# Patient Record
Sex: Male | Born: 1960 | Hispanic: No | Marital: Single | Smoking: Current every day smoker
Health system: Southern US, Community
[De-identification: ages and names within clinical notes are randomized; demographics above are authoritative.]

## PROBLEM LIST (undated history)

## (undated) DIAGNOSIS — F4310 Post-traumatic stress disorder, unspecified: Secondary | ICD-10-CM

## (undated) DIAGNOSIS — F329 Major depressive disorder, single episode, unspecified: Secondary | ICD-10-CM

## (undated) DIAGNOSIS — F32A Depression, unspecified: Secondary | ICD-10-CM

## (undated) HISTORY — DX: Depression, unspecified: F32.A

## (undated) HISTORY — DX: Major depressive disorder, single episode, unspecified: F32.9

## (undated) HISTORY — DX: Post-traumatic stress disorder, unspecified: F43.10

---

## 2003-08-16 ENCOUNTER — Encounter: Payer: Self-pay | Admitting: Emergency Medicine

## 2003-08-16 ENCOUNTER — Emergency Department (HOSPITAL_COMMUNITY): Admission: AD | Admit: 2003-08-16 | Discharge: 2003-08-16 | Payer: Self-pay | Admitting: Emergency Medicine

## 2006-07-30 ENCOUNTER — Emergency Department (HOSPITAL_COMMUNITY): Admission: EM | Admit: 2006-07-30 | Discharge: 2006-07-30 | Payer: Self-pay | Admitting: Emergency Medicine

## 2007-07-06 ENCOUNTER — Emergency Department (HOSPITAL_COMMUNITY): Admission: EM | Admit: 2007-07-06 | Discharge: 2007-07-06 | Payer: Self-pay | Admitting: *Deleted

## 2017-07-16 ENCOUNTER — Ambulatory Visit (INDEPENDENT_AMBULATORY_CARE_PROVIDER_SITE_OTHER): Payer: 59 | Admitting: Emergency Medicine

## 2017-07-16 ENCOUNTER — Encounter: Payer: Self-pay | Admitting: Emergency Medicine

## 2017-07-16 ENCOUNTER — Other Ambulatory Visit: Payer: Self-pay | Admitting: Emergency Medicine

## 2017-07-16 VITALS — BP 129/78 | HR 49 | Temp 97.9°F | Resp 16 | Ht 73.0 in | Wt 155.2 lb

## 2017-07-16 DIAGNOSIS — Z23 Encounter for immunization: Secondary | ICD-10-CM

## 2017-07-16 DIAGNOSIS — Z0001 Encounter for general adult medical examination with abnormal findings: Secondary | ICD-10-CM

## 2017-07-16 DIAGNOSIS — N342 Other urethritis: Secondary | ICD-10-CM | POA: Diagnosis not present

## 2017-07-16 DIAGNOSIS — Z0000 Encounter for general adult medical examination without abnormal findings: Secondary | ICD-10-CM | POA: Diagnosis not present

## 2017-07-16 MED ORDER — AZITHROMYCIN 1 G PO PACK: 1.0000 g | PACK | Freq: Once | ORAL | 0 refills | Status: AC

## 2017-07-16 MED ORDER — CEFTRIAXONE SODIUM 250 MG IJ SOLR
250.0000 mg | Freq: Once | INTRAMUSCULAR | Status: AC
  Administered 2017-07-16: 250 mg via INTRAMUSCULAR

## 2017-07-16 NOTE — Addendum Note (Signed)
Addended by: Beau Fanny on: 07/16/2017 01:45 PM   Modules accepted: Orders

## 2017-07-16 NOTE — Patient Instructions (Addendum)
IF you received an x-ray today, you will receive an invoice from [facility]. Please contact [facility] at [phone] with questions or concerns regarding your invoice.   IF you received labwork today, you will receive an invoice from [facility]. Please contact [facility] at [phone] with questions or concerns regarding your invoice.   Our billing staff will not be able to assist you with questions regarding bills from these companies.  You will be contacted with the lab results as soon as they are available. The fastest way to get your results is to activate your My Chart account. Instructions are located on the last page of this paperwork. If you have not heard from [place] regarding the results in 2 weeks, please contact this office.        Health Maintenance, Male A healthy lifestyle and preventive care is important for your health and wellness. Ask your health care provider about what schedule of regular examinations is right for you. What should I know about weight and diet? Eat a Healthy Diet  Eat plenty of vegetables, fruits, whole grains, low-fat dairy products, and lean protein.  Do not eat a lot of foods high in solid fats, added sugars, or salt.  Maintain a Healthy Weight Regular exercise can help you achieve or maintain a healthy weight. You should:  Do at least 150 minutes of exercise each week. The exercise should increase your heart rate and make you sweat (moderate-intensity exercise).  Do strength-training exercises at least twice a week.  Watch Your Levels of Cholesterol and Blood Lipids  Have your blood tested for lipids and cholesterol every 5 years starting at 56 years of age. If you are at high risk for heart disease, you should start having your blood tested when you are 56 years old. You may need to have your cholesterol levels checked more often if: ? Your lipid or cholesterol levels are high. ? You are older than 56 years of  age. ? You are at high risk for heart disease.  What should I know about cancer screening? Many types of cancers can be detected early and may often be prevented. Lung Cancer  You should be screened every year for lung cancer if: ? You are a current smoker who has smoked for at least 30 years. ? You are a former smoker who has quit within the past 15 years.  Talk to your health care provider about your screening options, when you should start screening, and how often you should be screened.  Colorectal Cancer  Routine colorectal cancer screening usually begins at 56 years of age and should be repeated every 5-10 years until you are 56 years old. You may need to be screened more often if early forms of precancerous polyps or small growths are found. Your health care provider may recommend screening at an earlier age if you have risk factors for colon cancer.  Your health care provider may recommend using home test kits to check for hidden blood in the stool.  A small camera at the end of a tube can be used to examine your colon (sigmoidoscopy or colonoscopy). This checks for the earliest forms of colorectal cancer.  Prostate and Testicular Cancer  Depending on your age and overall health, your health care provider may do certain tests to screen for prostate and testicular cancer.  Talk to your health care provider about any symptoms or concerns you have about testicular or prostate cancer.  Skin Cancer  Check  your skin from head to toe regularly.  Tell your health care provider about any new moles or changes in moles, especially if: ? There is a change in a mole's size, shape, or color. ? You have a mole that is larger than a pencil eraser.  Always use sunscreen. Apply sunscreen liberally and repeat throughout the day.  Protect yourself by wearing long sleeves, pants, a wide-brimmed hat, and sunglasses when outside.  What should I know about heart disease, diabetes, and high  blood pressure?  If you are 18-39 years of age, have your blood pressure checked every 3-5 years. If you are 40 years of age or older, have your blood pressure checked every year. You should have your blood pressure measured twice-once when you are at a hospital or clinic, and once when you are not at a hospital or clinic. Record the average of the two measurements. To check your blood pressure when you are not at a hospital or clinic, you can use: ? An automated blood pressure machine at a pharmacy. ? A home blood pressure monitor.  Talk to your health care provider about your target blood pressure.  If you are between 45-79 years old, ask your health care provider if you should take aspirin to prevent heart disease.  Have regular diabetes screenings by checking your fasting blood sugar level. ? If you are at a normal weight and have a low risk for diabetes, have this test once every three years after the age of 45. ? If you are overweight and have a high risk for diabetes, consider being tested at a younger age or more often.  A one-time screening for abdominal aortic aneurysm (AAA) by ultrasound is recommended for men aged 65-75 years who are current or former smokers. What should I know about preventing infection? Hepatitis B If you have a higher risk for hepatitis B, you should be screened for this virus. Talk with your health care provider to find out if you are at risk for hepatitis B infection. Hepatitis C Blood testing is recommended for:  Everyone born from 1945 through 1965.  Anyone with known risk factors for hepatitis C.  Sexually Transmitted Diseases (STDs)  You should be screened each year for STDs including gonorrhea and chlamydia if: ? You are sexually active and are younger than 56 years of age. ? You are older than 56 years of age and your health care provider tells you that you are at risk for this type of infection. ? Your sexual activity has changed since you were  last screened and you are at an increased risk for chlamydia or gonorrhea. Ask your health care provider if you are at risk.  Talk with your health care provider about whether you are at high risk of being infected with HIV. Your health care provider may recommend a prescription medicine to help prevent HIV infection.  What else can I do?  Schedule regular health, dental, and eye exams.  Stay current with your vaccines (immunizations).  Do not use any tobacco products, such as cigarettes, chewing tobacco, and e-cigarettes. If you need help quitting, ask your health care provider.  Limit alcohol intake to no more than 2 drinks per day. One drink equals 12 ounces of beer, 5 ounces of wine, or 1 ounces of hard liquor.  Do not use street drugs.  Do not share needles.  Ask your health care provider for help if you need support or information about quitting drugs.  Tell your   health care provider if you often feel depressed.  Tell your health care provider if you have ever been abused or do not feel safe at home. This information is not intended to replace advice given to you by your health care provider. Make sure you discuss any questions you have with your health care provider. Document Released: 05/07/2008 Document Revised: 07/08/2016 Document Reviewed: 08/13/2015 Elsevier Interactive Patient Education  2018 [employer].  American Heart Association (AHA) Exercise Recommendation  Being physically active is important to prevent heart disease and stroke, the nation's No. 1and No. 5killers. To improve overall cardiovascular health, we suggest at least 150 minutes per week of moderate exercise or 75 minutes per week of vigorous exercise (or a combination of moderate and vigorous activity). Thirty minutes a day, five times a week is an easy goal to remember. You will also experience benefits even if you divide your time into two or three segments of 10 to 15 minutes per day.  For people who  would benefit from lowering their blood pressure or cholesterol, we recommend 40 minutes of aerobic exercise of moderate to vigorous intensity three to four times a week to lower the risk for heart attack and stroke.  Physical activity is anything that makes you move your body and burn calories.  This includes things like climbing stairs or playing sports. Aerobic exercises benefit your heart, and include walking, jogging, swimming or biking. Strength and stretching exercises are best for overall stamina and flexibility.  The simplest, positive change you can make to effectively improve your heart health is to start walking. It's enjoyable, free, easy, social and great exercise. A walking program is flexible and boasts high success rates because people can stick with it. It's easy for walking to become a regular and satisfying part of life.   For Overall Cardiovascular Health:  At least 30 minutes of moderate-intensity aerobic activity at least 5 days per week for a total of 150  OR   At least 25 minutes of vigorous aerobic activity at least 3 days per week for a total of 75 minutes; or a combination of moderate- and vigorous-intensity aerobic activity  AND   Moderate- to high-intensity muscle-strengthening activity at least 2 days per week for additional health benefits.  For Lowering Blood Pressure and Cholesterol  An average 40 minutes of moderate- to vigorous-intensity aerobic activity 3 or 4 times per week  What if I can't make it to the time goal? Something is always better than nothing! And everyone has to start somewhere. Even if you've been sedentary for years, today is the day you can begin to make healthy changes in your life. If you don't think you'll make it for 30 or 40 minutes, set a reachable goal for today. You can work up toward your overall goal by increasing your time as you get stronger. Don't let all-or-nothing thinking rob you of doing what you can every day.   Source:http://www.heart.org    Urethritis, Adult Urethritis is an inflammation of the tube through which urine exits your bladder (urethra). What are the causes? Urethritis is often caused by an infection in your urethra. The infection can be viral, like herpes. The infection can also be bacterial, like gonorrhea. What increases the risk? Risk factors of urethritis include:  Having sex without using a condom.  Having multiple sexual partners.  Having poor hygiene.  What are the signs or symptoms? Symptoms of urethritis are less noticeable in women than in men. These symptoms include:  Burning feeling when you urinate (dysuria).  Discharge from your urethra.  Blood in your urine (hematuria).  Urinating more than usual.  How is this diagnosed? To confirm a diagnosis of urethritis, your health care provider will do the following:  Ask about your sexual history.  Perform a physical exam.  Have you provide a sample of your urine for lab testing.  Use a cotton swab to gently collect a sample from your urethra for lab testing.  How is this treated? It is important to treat urethritis. Depending on the cause, untreated urethritis may lead to serious genital infections and possibly infertility. Urethritis caused by a bacterial infection is treated with antibiotic medicine. All sexual partners must be treated. Follow these instructions at home:  Do not have sex until the test results are known and treatment is completed, even if your symptoms go away before you finish treatment.  If you were prescribed an antibiotic, finish it all even if you start to feel better. Contact a health care provider if:  Your symptoms are not improved in 3 days.  Your symptoms are getting worse.  You develop abdominal pain or pelvic pain (in women).  You develop joint pain.  You have a fever. Get help right away if:  You have severe pain in the belly, back, or side.  You have repeated  vomiting. This information is not intended to replace advice given to you by your health care provider. Make sure you discuss any questions you have with your health care provider. Document Released: 05/05/2001 Document Revised: 04/16/2016 Document Reviewed: 07/10/2013 Elsevier Interactive Patient Education  2017 [employer].

## 2017-07-16 NOTE — Progress Notes (Signed)
Troy Pacheco 56 y.o.   Chief Complaint  Patient presents with  . Establish Care  . Annual Exam    HISTORY OF PRESENT ILLNESS: This is a 56 y.o. male here for annual exam; has burning and penile discharge.  HPI   Prior to Admission medications   Not on File    No Known Allergies  There are no active problems to display for this patient.   Past Medical History:  Diagnosis Date  . Post traumatic stress disorder (PTSD)     No past surgical history on file.  Social History   Social History  . Marital status: Single    Spouse name: N/A  . Number of children: N/A  . Years of education: N/A   Occupational History  . Not on file.   Social History Main Topics  . Smoking status: Current Every Day Smoker    Packs/day: 0.50    Years: 20.00    Types: Cigarettes  . Smokeless tobacco: Never Used  . Alcohol use Not on file  . Drug use: No  . Sexual activity: Not on file   Other Topics Concern  . Not on file   Social History Narrative  . No narrative on file    Family History  Problem Relation Age of Onset  . Aneurysm Brother        brain  . Diabetes Maternal Grandmother   . Heart disease Maternal Grandmother        heart attack     Review of Systems  Constitutional: Negative.  Negative for chills, fever and weight loss.  HENT: Negative.   Eyes: Negative.   Respiratory: Negative.  Negative for cough and shortness of breath.   Cardiovascular: Negative.  Negative for chest pain and palpitations.  Gastrointestinal: Negative.  Negative for abdominal pain, blood in stool, melena and vomiting.  Genitourinary: Positive for dysuria. Negative for hematuria.       +penile discharge.  Musculoskeletal: Positive for joint pain (occasional elbow pains, mostly after work).  Skin: Negative.  Negative for rash.  Neurological: Negative.  Negative for dizziness and headaches.  Endo/Heme/Allergies: Negative.   All other systems reviewed and are negative.   Vitals:   07/16/17 1036  BP: 129/78  Pulse: (!) 49  Resp: 16  Temp: 97.9 F (36.6 C)  SpO2: 100%    Physical Exam  Constitutional: He is oriented to person, place, and time. He appears well-developed and well-nourished.  HENT:  Head: Normocephalic and atraumatic.  Eyes: Pupils are equal, round, and reactive to light. Conjunctivae and EOM are normal.  Neck: Normal range of motion. Neck supple.  Cardiovascular: Normal rate, regular rhythm, normal heart sounds and intact distal pulses.   Pulmonary/Chest: Effort normal and breath sounds normal.  Abdominal: Soft. Bowel sounds are normal. He exhibits no distension and no mass. There is no tenderness. No hernia. Hernia confirmed negative in the right inguinal area and confirmed negative in the left inguinal area.  Genitourinary: Testes normal. Circumcised. Discharge (purulent) found.  Musculoskeletal: Normal range of motion. He exhibits no edema or deformity.  Mild tenderness to medial epicondyles of both elbows.  Lymphadenopathy: No inguinal adenopathy noted on the right or left side.  Neurological: He is alert and oriented to person, place, and time. He displays normal reflexes. No sensory deficit. He exhibits normal muscle tone.  Skin: Skin is warm and dry. Capillary refill takes less than 2 seconds. No rash noted.  Psychiatric: He has a normal mood and affect. His behavior  is normal.  Vitals reviewed.    ASSESSMENT & PLAN:  Troy Pacheco was seen today for establish care and annual exam.  Diagnoses and all orders for this visit:  Encounter for general adult medical examination with abnormal findings -     CBC with Differential -     Comprehensive metabolic panel -     Hemoglobin A1c -     Lipid panel -     Hepatitis C antibody screen -     HIV antibody -     Ambulatory referral to Gastroenterology -     Tdap vaccine greater than or equal to 7yo IM  Urethritis -     cefTRIAXone (ROCEPHIN) injection 250 mg; Inject 250 mg into the muscle  once.  Other orders -     azithromycin (ZITHROMAX) 1 g powder; Take 1 packet (1 g total) by mouth once.    Patient Instructions       IF you received an x-ray today, you will receive an invoice from [facility]. Please contact [facility] at [phone] with questions or concerns regarding your invoice.   IF you received labwork today, you will receive an invoice from [place]. Please contact [facility] at [phone] with questions or concerns regarding your invoice.   Our billing staff will not be able to assist you with questions regarding bills from these companies.  You will be contacted with the lab results as soon as they are available. The fastest way to get your results is to activate your My Chart account. Instructions are located on the last page of this paperwork. If you have not heard from [place] regarding the results in 2 weeks, please contact this office.        Health Maintenance, Male A healthy lifestyle and preventive care is important for your health and wellness. Ask your health care provider about what schedule of regular examinations is right for you. What should I know about weight and diet? Eat a Healthy Diet  Eat plenty of vegetables, fruits, whole grains, low-fat dairy products, and lean protein.  Do not eat a lot of foods high in solid fats, added sugars, or salt.  Maintain a Healthy Weight Regular exercise can help you achieve or maintain a healthy weight. You should:  Do at least 150 minutes of exercise each week. The exercise should increase your heart rate and make you sweat (moderate-intensity exercise).  Do strength-training exercises at least twice a week.  Watch Your Levels of Cholesterol and Blood Lipids  Have your blood tested for lipids and cholesterol every 5 years starting at 56 years of age. If you are at high risk for heart disease, you should start having your blood tested when you are 56 years old. You may need to  have your cholesterol levels checked more often if: ? Your lipid or cholesterol levels are high. ? You are older than 56 years of age. ? You are at high risk for heart disease.  What should I know about cancer screening? Many types of cancers can be detected early and may often be prevented. Lung Cancer  You should be screened every year for lung cancer if: ? You are a current smoker who has smoked for at least 30 years. ? You are a former smoker who has quit within the past 15 years.  Talk to your health care provider about your screening options, when you should start screening, and how often you should be screened.  Colorectal Cancer  Routine colorectal cancer  screening usually begins at 56 years of age and should be repeated every 5-10 years until you are 56 years old. You may need to be screened more often if early forms of precancerous polyps or small growths are found. Your health care provider may recommend screening at an earlier age if you have risk factors for colon cancer.  Your health care provider may recommend using home test kits to check for hidden blood in the stool.  A small camera at the end of a tube can be used to examine your colon (sigmoidoscopy or colonoscopy). This checks for the earliest forms of colorectal cancer.  Prostate and Testicular Cancer  Depending on your age and overall health, your health care provider may do certain tests to screen for prostate and testicular cancer.  Talk to your health care provider about any symptoms or concerns you have about testicular or prostate cancer.  Skin Cancer  Check your skin from head to toe regularly.  Tell your health care provider about any new moles or changes in moles, especially if: ? There is a change in a mole's size, shape, or color. ? You have a mole that is larger than a pencil eraser.  Always use sunscreen. Apply sunscreen liberally and repeat throughout the day.  Protect yourself by wearing  long sleeves, pants, a wide-brimmed hat, and sunglasses when outside.  What should I know about heart disease, diabetes, and high blood pressure?  If you are 66-34 years of age, have your blood pressure checked every 3-5 years. If you are 69 years of age or older, have your blood pressure checked every year. You should have your blood pressure measured twice-once when you are at a hospital or clinic, and once when you are not at a hospital or clinic. Record the average of the two measurements. To check your blood pressure when you are not at a hospital or clinic, you can use: ? An automated blood pressure machine at a pharmacy. ? A home blood pressure monitor.  Talk to your health care provider about your target blood pressure.  If you are between 38-64 years old, ask your health care provider if you should take aspirin to prevent heart disease.  Have regular diabetes screenings by checking your fasting blood sugar level. ? If you are at a normal weight and have a low risk for diabetes, have this test once every three years after the age of 27. ? If you are overweight and have a high risk for diabetes, consider being tested at a younger age or more often.  A one-time screening for abdominal aortic aneurysm (AAA) by ultrasound is recommended for men aged 65-75 years who are current or former smokers. What should I know about preventing infection? Hepatitis B If you have a higher risk for hepatitis B, you should be screened for this virus. Talk with your health care provider to find out if you are at risk for hepatitis B infection. Hepatitis C Blood testing is recommended for:  Everyone born from [birth year] through 1965.  Anyone with known risk factors for hepatitis C.  Sexually Transmitted Diseases (STDs)  You should be screened each year for STDs including gonorrhea and chlamydia if: ? You are sexually active and are younger than 56 years of age. ? You are older than 56 years of age and your  health care provider tells you that you are at risk for this type of infection. ? Your sexual activity has changed since you were last screened  and you are at an increased risk for chlamydia or gonorrhea. Ask your health care provider if you are at risk.  Talk with your health care provider about whether you are at high risk of being infected with HIV. Your health care provider may recommend a prescription medicine to help prevent HIV infection.  What else can I do?  Schedule regular health, dental, and eye exams.  Stay current with your vaccines (immunizations).  Do not use any tobacco products, such as cigarettes, chewing tobacco, and e-cigarettes. If you need help quitting, ask your health care provider.  Limit alcohol intake to no more than 2 drinks per day. One drink equals 12 ounces of beer, 5 ounces of wine, or 1 ounces of hard liquor.  Do not use street drugs.  Do not share needles.  Ask your health care provider for help if you need support or information about quitting drugs.  Tell your health care provider if you often feel depressed.  Tell your health care provider if you have ever been abused or do not feel safe at home. This information is not intended to replace advice given to you by your health care provider. Make sure you discuss any questions you have with your health care provider. Document Released: 05/07/2008 Document Revised: 07/08/2016 Document Reviewed: 08/13/2015 Elsevier Interactive Patient Education  2018 [employer].  American Heart Association (AHA) Exercise Recommendation  Being physically active is important to prevent heart disease and stroke, the nation's No. 1and No. 5killers. To improve overall cardiovascular health, we suggest at least 150 minutes per week of moderate exercise or 75 minutes per week of vigorous exercise (or a combination of moderate and vigorous activity). Thirty minutes a day, five times a week is an easy goal to remember. You  will also experience benefits even if you divide your time into two or three segments of 10 to 15 minutes per day.  For people who would benefit from lowering their blood pressure or cholesterol, we recommend 40 minutes of aerobic exercise of moderate to vigorous intensity three to four times a week to lower the risk for heart attack and stroke.  Physical activity is anything that makes you move your body and burn calories.  This includes things like climbing stairs or playing sports. Aerobic exercises benefit your heart, and include walking, jogging, swimming or biking. Strength and stretching exercises are best for overall stamina and flexibility.  The simplest, positive change you can make to effectively improve your heart health is to start walking. It's enjoyable, free, easy, social and great exercise. A walking program is flexible and boasts high success rates because people can stick with it. It's easy for walking to become a regular and satisfying part of life.   For Overall Cardiovascular Health:  At least 30 minutes of moderate-intensity aerobic activity at least 5 days per week for a total of 150  OR   At least 25 minutes of vigorous aerobic activity at least 3 days per week for a total of 75 minutes; or a combination of moderate- and vigorous-intensity aerobic activity  AND   Moderate- to high-intensity muscle-strengthening activity at least 2 days per week for additional health benefits.  For Lowering Blood Pressure and Cholesterol  An average 40 minutes of moderate- to vigorous-intensity aerobic activity 3 or 4 times per week  What if I can't make it to the time goal? Something is always better than nothing! And everyone has to start somewhere. Even if you've been sedentary for  years, today is the day you can begin to make healthy changes in your life. If you don't think you'll make it for 30 or 40 minutes, set a reachable goal for today. You can work up toward your  overall goal by increasing your time as you get stronger. Don't let all-or-nothing thinking rob you of doing what you can every day.  Source:http://www.heart.org    Urethritis, Adult Urethritis is an inflammation of the tube through which urine exits your bladder (urethra). What are the causes? Urethritis is often caused by an infection in your urethra. The infection can be viral, like herpes. The infection can also be bacterial, like gonorrhea. What increases the risk? Risk factors of urethritis include:  Having sex without using a condom.  Having multiple sexual partners.  Having poor hygiene.  What are the signs or symptoms? Symptoms of urethritis are less noticeable in women than in men. These symptoms include:  Burning feeling when you urinate (dysuria).  Discharge from your urethra.  Blood in your urine (hematuria).  Urinating more than usual.  How is this diagnosed? To confirm a diagnosis of urethritis, your health care provider will do the following:  Ask about your sexual history.  Perform a physical exam.  Have you provide a sample of your urine for lab testing.  Use a cotton swab to gently collect a sample from your urethra for lab testing.  How is this treated? It is important to treat urethritis. Depending on the cause, untreated urethritis may lead to serious genital infections and possibly infertility. Urethritis caused by a bacterial infection is treated with antibiotic medicine. All sexual partners must be treated. Follow these instructions at home:  Do not have sex until the test results are known and treatment is completed, even if your symptoms go away before you finish treatment.  If you were prescribed an antibiotic, finish it all even if you start to feel better. Contact a health care provider if:  Your symptoms are not improved in 3 days.  Your symptoms are getting worse.  You develop abdominal pain or pelvic pain (in women).  You develop  joint pain.  You have a fever. Get help right away if:  You have severe pain in the belly, back, or side.  You have repeated vomiting. This information is not intended to replace advice given to you by your health care provider. Make sure you discuss any questions you have with your health care provider. Document Released: 05/05/2001 Document Revised: 04/16/2016 Document Reviewed: 07/10/2013 Elsevier Interactive Patient Education  2017 [employer]vier Inc.        Edwina Barth, MD [facility]

## 2017-07-17 LAB — COMPREHENSIVE METABOLIC PANEL
ALK PHOS: 99 IU/L (ref 39–117)
ALT: 21 IU/L (ref 0–44)
AST: 21 IU/L (ref 0–40)
Albumin/Globulin Ratio: 2 (ref 1.2–2.2)
Albumin: 5 g/dL (ref 3.5–5.5)
BILIRUBIN TOTAL: 0.6 mg/dL (ref 0.0–1.2)
BUN/Creatinine Ratio: 14 (ref 9–20)
BUN: 16 mg/dL (ref 6–24)
CO2: 22 mmol/L (ref 20–29)
CREATININE: 1.15 mg/dL (ref 0.76–1.27)
Calcium: 10.1 mg/dL (ref 8.7–10.2)
Chloride: 101 mmol/L (ref 96–106)
GFR calc Af Amer: 82 mL/min/{1.73_m2} (ref >59)
GFR calc non Af Amer: 71 mL/min/{1.73_m2} (ref >59)
GLOBULIN, TOTAL: 2.5 g/dL (ref 1.5–4.5)
GLUCOSE: 78 mg/dL (ref 65–99)
Potassium: 4.4 mmol/L (ref 3.5–5.2)
SODIUM: 141 mmol/L (ref 134–144)
Total Protein: 7.5 g/dL (ref 6.0–8.5)

## 2017-07-17 LAB — LIPID PANEL
CHOLESTEROL TOTAL: 257 mg/dL — AB (ref 100–199)
Chol/HDL Ratio: 3 ratio (ref 0.0–5.0)
HDL: 87 mg/dL (ref >39)
LDL Calculated: 161 mg/dL — ABNORMAL HIGH (ref 0–99)
TRIGLYCERIDES: 47 mg/dL (ref 0–149)
VLDL Cholesterol Cal: 9 mg/dL (ref 5–40)

## 2017-07-17 LAB — HEMOGLOBIN A1C
ESTIMATED AVERAGE GLUCOSE: 117 mg/dL
HEMOGLOBIN A1C: 5.7 % — AB (ref 4.8–5.6)

## 2017-07-17 LAB — CBC WITH DIFFERENTIAL/PLATELET
BASOS: 0 %
Basophils Absolute: 0 10*3/uL (ref 0.0–0.2)
EOS (ABSOLUTE): 0.1 10*3/uL (ref 0.0–0.4)
EOS: 1 %
HEMATOCRIT: 43.1 % (ref 37.5–51.0)
HEMOGLOBIN: 14.8 g/dL (ref 13.0–17.7)
IMMATURE GRANULOCYTES: 0 %
Immature Grans (Abs): 0 10*3/uL (ref 0.0–0.1)
LYMPHS ABS: 2.4 10*3/uL (ref 0.7–3.1)
Lymphs: 31 %
MCH: 30.2 pg (ref 26.6–33.0)
MCHC: 34.3 g/dL (ref 31.5–35.7)
MCV: 88 fL (ref 79–97)
MONOCYTES: 9 %
Monocytes Absolute: 0.7 10*3/uL (ref 0.1–0.9)
NEUTROS PCT: 59 %
Neutrophils Absolute: 4.6 10*3/uL (ref 1.4–7.0)
Platelets: 310 10*3/uL (ref 150–379)
RBC: 4.9 x10E6/uL (ref 4.14–5.80)
RDW: 13.5 % (ref 12.3–15.4)
WBC: 7.8 10*3/uL (ref 3.4–10.8)

## 2017-07-17 LAB — HEPATITIS C ANTIBODY: Hep C Virus Ab: <0.1 s/co ratio (ref 0.0–0.9)

## 2017-07-17 LAB — HIV ANTIBODY (ROUTINE TESTING W REFLEX): HIV SCREEN 4TH GENERATION: NONREACTIVE

## 2017-07-19 ENCOUNTER — Encounter: Payer: Self-pay | Admitting: *Deleted

## 2017-07-20 LAB — GC/CHLAMYDIA PROBE AMP
Chlamydia trachomatis, NAA: NEGATIVE
Neisseria gonorrhoeae by PCR: POSITIVE — AB

## 2017-07-21 ENCOUNTER — Encounter: Payer: Self-pay | Admitting: Radiology

## 2017-07-21 ENCOUNTER — Encounter: Payer: Self-pay | Admitting: Gastroenterology

## 2017-09-22 ENCOUNTER — Encounter: Payer: Self-pay | Admitting: Gastroenterology

## 2017-10-01 ENCOUNTER — Encounter: Payer: Self-pay | Admitting: Emergency Medicine

## 2017-10-01 ENCOUNTER — Ambulatory Visit: Payer: 59 | Admitting: Emergency Medicine

## 2017-10-01 ENCOUNTER — Other Ambulatory Visit: Payer: Self-pay

## 2017-10-01 VITALS — BP 102/84 | HR 58 | Temp 98.3°F | Resp 16 | Ht 73.0 in | Wt 156.0 lb

## 2017-10-01 DIAGNOSIS — N342 Other urethritis: Secondary | ICD-10-CM | POA: Diagnosis not present

## 2017-10-01 DIAGNOSIS — R369 Urethral discharge, unspecified: Secondary | ICD-10-CM

## 2017-10-01 DIAGNOSIS — Z23 Encounter for immunization: Secondary | ICD-10-CM | POA: Insufficient documentation

## 2017-10-01 MED ORDER — CEFTRIAXONE SODIUM 250 MG IJ SOLR
250.0000 mg | Freq: Once | INTRAMUSCULAR | Status: AC
  Administered 2017-10-01: 250 mg via INTRAMUSCULAR

## 2017-10-01 MED ORDER — AZITHROMYCIN 1 G PO PACK: 1.0000 g | PACK | Freq: Once | ORAL | 0 refills | Status: AC

## 2017-10-01 NOTE — Patient Instructions (Addendum)
     IF you received an x-ray today, you will receive an [facility] from [facility]. Please contact [facility] at [phone] with questions or concerns regarding your invoice.   IF you received labwork today, you will receive an invoice from [facility]. Please contact [facility] at [phone] with questions or concerns regarding your invoice.   Our billing staff will not be able to assist you with questions regarding bills from these companies.  You will be contacted with the lab results as soon as they are available. The fastest way to get your results is to activate your My Chart account. Instructions are located on the last page of this paperwork. If you have not heard from us regarding the results in 2 weeks, please contact this office.     Urethritis, Adult Urethritis is an inflammation of the tube through which urine exits your bladder (urethra). What are the causes? Urethritis is often caused by an infection in your urethra. The infection can be viral, like herpes. The infection can also be bacterial, like gonorrhea. What increases the risk? Risk factors of urethritis include:  Having sex without using a condom.  Having multiple sexual partners.  Having poor hygiene.  What are the signs or symptoms? Symptoms of urethritis are less noticeable in women than in men. These symptoms include:  Burning feeling when you urinate (dysuria).  Discharge from your urethra.  Blood in your urine (hematuria).  Urinating more than usual.  How is this diagnosed? To confirm a diagnosis of urethritis, your health care provider will do the following:  Ask about your sexual history.  Perform a physical exam.  Have you provide a sample of your urine for lab testing.  Use a cotton swab to gently collect a sample from your urethra for lab testing.  How is this treated? It is important to treat urethritis. Depending on the cause, untreated urethritis may lead to serious  genital infections and possibly infertility. Urethritis caused by a bacterial infection is treated with antibiotic medicine. All sexual partners must be treated. Follow these instructions at home:  Do not have sex until the test results are known and treatment is completed, even if your symptoms go away before you finish treatment.  If you were prescribed an antibiotic, finish it all even if you start to feel better. Contact a health care provider if:  Your symptoms are not improved in 3 days.  Your symptoms are getting worse.  You develop abdominal pain or pelvic pain (in women).  You develop joint pain.  You have a fever. Get help right away if:  You have severe pain in the belly, back, or side.  You have repeated vomiting. This information is not intended to replace advice given to you by your health care provider. Make sure you discuss any questions you have with your health care provider. Document Released: 05/05/2001 Document Revised: 04/16/2016 Document Reviewed: 07/10/2013 Elsevier Interactive Patient Education  2017 [employer]

## 2017-10-01 NOTE — Progress Notes (Signed)
Troy Pacheco 56 y.o.   Chief Complaint  Patient presents with  . Penile Discharge    x 2 weeks    HISTORY OF PRESENT ILLNESS: This is a 56 y.o. male complaining of green penile discharge.  Urinary Tract Infection   This is a new problem. The current episode started 1 to 4 weeks ago. The problem occurs every urination. The problem has been gradually worsening. The quality of the pain is described as burning and aching. The pain is at a severity of 3/10. The pain is mild. There has been no fever. He is sexually active. There is no history of pyelonephritis. Associated symptoms include a discharge. Pertinent negatives include no chills, flank pain, frequency, hematuria, nausea or vomiting.     Prior to Admission medications   Not on File    No Known Allergies  Patient Active Problem List   Diagnosis Date Noted  . Encounter for general adult medical examination with abnormal findings 07/16/2017  . Urethritis 07/16/2017    Past Medical History:  Diagnosis Date  . Depression   . Post traumatic stress disorder (PTSD)     No past surgical history on file.  Social History   Socioeconomic History  . Marital status: Single    Spouse name: Not on file  . Number of children: Not on file  . Years of education: Not on file  . Highest education level: Not on file  Social Needs  . Financial resource strain: Not on file  . Food insecurity - worry: Not on file  . Food insecurity - inability: Not on file  . Transportation needs - medical: Not on file  . Transportation needs - non-medical: Not on file  Occupational History  . Not on file  Tobacco Use  . Smoking status: Current Every Day Smoker    Packs/day: 0.50    Years: 20.00    Pack years: 10.00    Types: Cigarettes  . Smokeless tobacco: Never Used  Substance and Sexual Activity  . Alcohol use: Not on file  . Drug use: Yes    Types: Marijuana  . Sexual activity: Not on file  Other Topics Concern  . Not on file    Social History Narrative  . Not on file    Family History  Problem Relation Age of Onset  . Aneurysm Brother        brain  . Diabetes Maternal Grandmother   . Heart disease Maternal Grandmother        heart attack     Review of Systems  Constitutional: Negative for chills, fever and weight loss.  HENT: Negative for sore throat.   Eyes: Negative for discharge and redness.  Respiratory: Negative for cough and shortness of breath.   Cardiovascular: Negative for chest pain and palpitations.  Gastrointestinal: Negative for abdominal pain, nausea and vomiting.  Genitourinary: Positive for dysuria. Negative for flank pain, frequency and hematuria.  Musculoskeletal: Negative for back pain, joint pain and myalgias.  Skin: Negative.  Negative for rash.  Neurological: Negative.  Negative for dizziness and headaches.  Endo/Heme/Allergies: Negative.   All other systems reviewed and are negative.    Vitals:   10/01/17 0850  BP: 102/84  Pulse: (!) 58  Resp: 16  Temp: 98.3 F (36.8 C)  SpO2: 99%     Physical Exam  Constitutional: He is oriented to person, place, and time. He appears well-developed and well-nourished.  HENT:  Head: Normocephalic and atraumatic.  Mouth/Throat: Oropharynx is clear and moist.  No oropharyngeal exudate.  Eyes: Conjunctivae and EOM are normal. Pupils are equal, round, and reactive to light.  Neck: Normal range of motion.  Cardiovascular: Normal rate and regular rhythm.  Pulmonary/Chest: Effort normal and breath sounds normal.  Abdominal: Soft. He exhibits no distension. There is no tenderness.  Genitourinary: Testes normal. Circumcised. Discharge (greenish) found.  Musculoskeletal: Normal range of motion.  Lymphadenopathy: No inguinal adenopathy noted on the right or left side.  Neurological: He is alert and oriented to person, place, and time. No sensory deficit. He exhibits normal muscle tone.  Skin: Skin is warm and dry. No rash noted.   Psychiatric: He has a normal mood and affect. His behavior is normal.  Vitals reviewed.    ASSESSMENT & PLAN: Lorella NimrodHarvey was seen today for penile discharge.  Diagnoses and all orders for this visit:  Penile discharge -     cefTRIAXone (ROCEPHIN) injection 250 mg -     GC/Chlamydia Probe Amp  Need for prophylactic vaccination and inoculation against influenza -     Flu Vaccine QUAD 36+ mos IM  Urethritis -     cefTRIAXone (ROCEPHIN) injection 250 mg -     GC/Chlamydia Probe Amp  Other orders -     azithromycin (ZITHROMAX) 1 g powder; Take 1 packet (1 g total) once for 1 dose by mouth.    Patient Instructions       IF you received an x-ray today, you will receive an invoice from [facility]. Please contact [facility] at [phone] with questions or concerns regarding your invoice.   IF you received labwork today, you will receive an invoice from [facility]. Please contact [facility] at [phone] with questions or concerns regarding your invoice.   Our billing staff will not be able to assist you with questions regarding bills from these companies.  You will be contacted with the lab results as soon as they are available. The fastest way to get your results is to activate your My Chart account. Instructions are located on the last page of this paperwork. If you have not heard from us regarding the results in 2 weeks, please contact this office.     Urethritis, Adult Urethritis is an inflammation of the tube through which urine exits your bladder (urethra). What are the causes? Urethritis is often caused by an infection in your urethra. The infection can be viral, like herpes. The infection can also be bacterial, like gonorrhea. What increases the risk? Risk factors of urethritis include:  Having sex without using a condom.  Having multiple sexual partners.  Having poor hygiene.  What are the signs or symptoms? Symptoms of urethritis are less  noticeable in women than in men. These symptoms include:  Burning feeling when you urinate (dysuria).  Discharge from your urethra.  Blood in your urine (hematuria).  Urinating more than usual.  How is this diagnosed? To confirm a diagnosis of urethritis, your health care provider will do the following:  Ask about your sexual history.  Perform a physical exam.  Have you provide a sample of your urine for lab testing.  Use a cotton swab to gently collect a sample from your urethra for lab testing.  How is this treated? It is important to treat urethritis. Depending on the cause, untreated urethritis may lead to serious genital infections and possibly infertility. Urethritis caused by a bacterial infection is treated with antibiotic medicine. All sexual partners must be treated. Follow these instructions at home:  Do not have sex until  the test results are known and treatment is completed, even if your symptoms go away before you finish treatment.  If you were prescribed an antibiotic, finish it all even if you start to feel better. Contact a health care provider if:  Your symptoms are not improved in 3 days.  Your symptoms are getting worse.  You develop abdominal pain or pelvic pain (in women).  You develop joint pain.  You have a fever. Get help right away if:  You have severe pain in the belly, back, or side.  You have repeated vomiting. This information is not intended to replace advice given to you by your health care provider. Make sure you discuss any questions you have with your health care provider. Document Released: 05/05/2001 Document Revised: 04/16/2016 Document Reviewed: 07/10/2013 Elsevier Interactive Patient Education  2017 Elsevier Inc.      Edwina BarthMiguel Alyanah Elliott, MD [facility]

## 2017-10-03 LAB — GC/CHLAMYDIA PROBE AMP
CHLAMYDIA, DNA PROBE: POSITIVE — AB
NEISSERIA GONORRHOEAE BY PCR: POSITIVE — AB

## 2017-10-05 ENCOUNTER — Encounter: Payer: Self-pay | Admitting: Radiology

## 2017-11-30 ENCOUNTER — Encounter: Payer: Self-pay | Admitting: Emergency Medicine

## 2019-07-20 ENCOUNTER — Other Ambulatory Visit: Payer: Self-pay

## 2019-07-20 DIAGNOSIS — Z20822 Contact with and (suspected) exposure to covid-19: Secondary | ICD-10-CM

## 2019-07-22 LAB — SPECIMEN STATUS REPORT

## 2019-07-22 LAB — NOVEL CORONAVIRUS, NAA: SARS-CoV-2, NAA: NOT DETECTED

## 2019-07-24 ENCOUNTER — Telehealth: Payer: Self-pay | Admitting: Emergency Medicine

## 2019-07-24 NOTE — Telephone Encounter (Signed)
Negative COVID results given. Patient results "NOT Detected." Caller expressed understanding. ° °

## 2019-07-25 ENCOUNTER — Emergency Department (HOSPITAL_COMMUNITY)
Admission: EM | Admit: 2019-07-25 | Discharge: 2019-07-25 | Disposition: A | Discharge status: Home / Self Care | Payer: 59 | Attending: Emergency Medicine | Admitting: Emergency Medicine

## 2019-07-25 ENCOUNTER — Other Ambulatory Visit: Payer: Self-pay

## 2019-07-25 ENCOUNTER — Emergency Department (HOSPITAL_COMMUNITY): Payer: 59

## 2019-07-25 ENCOUNTER — Encounter (HOSPITAL_COMMUNITY): Payer: Self-pay | Admitting: Emergency Medicine

## 2019-07-25 DIAGNOSIS — R0602 Shortness of breath: Secondary | ICD-10-CM | POA: Diagnosis not present

## 2019-07-25 DIAGNOSIS — F17210 Nicotine dependence, cigarettes, uncomplicated: Secondary | ICD-10-CM | POA: Diagnosis not present

## 2019-07-25 DIAGNOSIS — F1210 Cannabis abuse, uncomplicated: Secondary | ICD-10-CM | POA: Diagnosis not present

## 2019-07-25 DIAGNOSIS — R05 Cough: Secondary | ICD-10-CM | POA: Diagnosis present

## 2019-07-25 DIAGNOSIS — J189 Pneumonia, unspecified organism: Secondary | ICD-10-CM

## 2019-07-25 DIAGNOSIS — R509 Fever, unspecified: Secondary | ICD-10-CM | POA: Insufficient documentation

## 2019-07-25 DIAGNOSIS — Z20828 Contact with and (suspected) exposure to other viral communicable diseases: Secondary | ICD-10-CM | POA: Diagnosis not present

## 2019-07-25 LAB — CBC WITH DIFFERENTIAL/PLATELET
Abs Immature Granulocytes: 0.06 10*3/uL (ref 0.00–0.07)
Basophils Absolute: 0.1 10*3/uL (ref 0.0–0.1)
Basophils Relative: 1 %
Eosinophils Absolute: 0.2 10*3/uL (ref 0.0–0.5)
Eosinophils Relative: 3 %
HCT: 43.5 % (ref 39.0–52.0)
Hemoglobin: 13.5 g/dL (ref 13.0–17.0)
Immature Granulocytes: 1 %
Lymphocytes Relative: 28 %
Lymphs Abs: 2.3 10*3/uL (ref 0.7–4.0)
MCH: 29.2 pg (ref 26.0–34.0)
MCHC: 31 g/dL (ref 30.0–36.0)
MCV: 94.2 fL (ref 80.0–100.0)
Monocytes Absolute: 0.8 10*3/uL (ref 0.1–1.0)
Monocytes Relative: 10 %
Neutro Abs: 4.8 10*3/uL (ref 1.7–7.7)
Neutrophils Relative %: 57 %
Platelets: 512 10*3/uL — ABNORMAL HIGH (ref 150–400)
RBC: 4.62 MIL/uL (ref 4.22–5.81)
RDW: 13.7 % (ref 11.5–15.5)
WBC: 8.2 10*3/uL (ref 4.0–10.5)
nRBC: 0 % (ref 0.0–0.2)

## 2019-07-25 LAB — BASIC METABOLIC PANEL
Anion gap: 12 (ref 5–15)
BUN: 15 mg/dL (ref 6–20)
CO2: 27 mmol/L (ref 22–32)
Calcium: 9.2 mg/dL (ref 8.9–10.3)
Chloride: 102 mmol/L (ref 98–111)
Creatinine, Ser: 1.19 mg/dL (ref 0.61–1.24)
GFR calc Af Amer: >60 mL/min (ref >60)
GFR calc non Af Amer: >60 mL/min (ref >60)
Glucose, Bld: 98 mg/dL (ref 70–99)
Potassium: 4.8 mmol/L (ref 3.5–5.1)
Sodium: 141 mmol/L (ref 135–145)

## 2019-07-25 LAB — D-DIMER, QUANTITATIVE: D-Dimer, Quant: 1.79 ug/mL-FEU — ABNORMAL HIGH (ref 0.00–0.50)

## 2019-07-25 LAB — SARS CORONAVIRUS 2 BY RT PCR (HOSPITAL ORDER, PERFORMED IN ~~LOC~~ HOSPITAL LAB): SARS Coronavirus 2: NEGATIVE

## 2019-07-25 MED ORDER — IOHEXOL 350 MG/ML SOLN
100.0000 mL | Freq: Once | INTRAVENOUS | Status: AC | PRN
  Administered 2019-07-25: 100 mL via INTRAVENOUS

## 2019-07-25 MED ORDER — AMOXICILLIN-POT CLAVULANATE 875-125 MG PO TABS: 1.0000 | ORAL_TABLET | Freq: Two times a day (BID) | ORAL | 0 refills | Status: DC

## 2019-07-25 NOTE — Discharge Instructions (Signed)
Please return for any problem. Follow up with your regular car provider as instructed.

## 2019-07-25 NOTE — ED Triage Notes (Signed)
Pt reports that he has been having pains on left side when takes a deep breathsince last week before he had his Covid test last week. Reports got results back and was negative.

## 2019-07-25 NOTE — ED Provider Notes (Signed)
[facility]-EMERGENCY DEPT Provider Note   CSN: [id number] Arrival date & time: 07/25/19  7096     History   Chief Complaint Chief Complaint  Patient presents with   pain when takes deep breath    HPI Troy Pacheco is a 58 y.o. male.     58 year old male with prior medical history as detailed below presents for evaluation of cough, fever, and shortness of breath.  Patient reports symptoms over the last 5 days.  Patient reports that 3 days ago he was tested in a walk in clinic for COVID.  He reports that his cover test was negative at that time.  Patient complains of continued left-sided pleuritic pain.  Patient denies fever in the last 24 hours.  He reports his maximum temperature was 102 3 days ago.  He denies current chest pain.  He denies associated nausea or vomiting. His reported cough is dry.   The history is provided by the patient and medical records.  Cough Cough characteristics:  Non-productive Sputum characteristics:  Nondescript Severity:  Moderate Onset quality:  Gradual Duration:  5 days Timing:  Intermittent Progression:  Waxing and waning Chronicity:  New Relieved by:  Nothing Worsened by:  Nothing   Past Medical History:  Diagnosis Date   Depression    Post traumatic stress disorder (PTSD)     Patient Active Problem List   Diagnosis Date Noted   Need for prophylactic vaccination and inoculation against influenza 10/01/2017   Penile discharge 10/01/2017   Encounter for general adult medical examination with abnormal findings 07/16/2017   Urethritis 07/16/2017    History reviewed. No pertinent surgical history.      Home Medications    Prior to Admission medications   Not on File    Family History Family History  Problem Relation Age of Onset   Aneurysm Brother        brain   Diabetes Maternal Grandmother    Heart disease Maternal Grandmother        heart attack    Social History Social History     Tobacco Use   Smoking status: Current Every Day Smoker    Packs/day: 0.50    Years: 20.00    Pack years: 10.00    Types: Cigarettes   Smokeless tobacco: Never Used  Substance Use Topics   Alcohol use: Not on file   Drug use: Yes    Types: Marijuana     Allergies   Patient has no known allergies.   Review of Systems Review of Systems  Respiratory: Positive for cough.   All other systems reviewed and are negative.    Physical Exam Updated Vital Signs BP 129/88 (BP Location: Right Arm)    Pulse 68    Temp 98.3 F (36.8 C) (Oral)    Resp 16    SpO2 100%   Physical Exam Vitals signs and nursing note reviewed.  Constitutional:      General: He is not in acute distress.    Appearance: Normal appearance. He is well-developed.  HENT:     Head: Normocephalic and atraumatic.  Eyes:     Conjunctiva/sclera: Conjunctivae normal.     Pupils: Pupils are equal, round, and reactive to light.  Neck:     Musculoskeletal: Normal range of motion and neck supple.  Cardiovascular:     Rate and Rhythm: Normal rate and regular rhythm.     Pulses: Normal pulses.     Heart sounds: Normal heart sounds.  Pulmonary:     Effort: Pulmonary effort is normal. No respiratory distress.     Breath sounds: Normal breath sounds.  Abdominal:     General: There is no distension.     Palpations: Abdomen is soft.     Tenderness: There is no abdominal tenderness.  Musculoskeletal: Normal range of motion.        General: No deformity.  Skin:    General: Skin is warm and dry.  Neurological:     General: No focal deficit present.     Mental Status: He is alert and oriented to person, place, and time. Mental status is at baseline.      ED Treatments / Results  Labs (all labs ordered are listed, but only abnormal results are displayed) Labs Reviewed  CBC WITH DIFFERENTIAL/PLATELET - Abnormal; Notable for the following components:      Result Value   Platelets 512 (*)    All other  components within normal limits  D-DIMER, QUANTITATIVE (NOT AT [facility]) - Abnormal; Notable for the following components:   D-Dimer, Quant 1.79 (*)    All other components within normal limits  SARS CORONAVIRUS 2 (HOSPITAL ORDER, [facility])  BASIC METABOLIC PANEL    EKG EKG Interpretation  Date/Time:  Tuesday July 25 2019 10:26:44 EDT Ventricular Rate:  57 PR Interval:    QRS Duration: 102 QT Interval:  452 QTC Calculation: 441 R Axis:   79 Text Interpretation:  Sinus rhythm Abnrm T, consider ischemia, anterolateral lds Minimal ST elevation, anterior leads Baseline wander in lead(s) II III aVR aVF V3 V4 V5 V6 Confirmed by Dene Gentry [phone]) on 07/25/2019 10:31:20 AM   Radiology Ct Angio Chest Pe W And/or Wo Contrast  Result Date: 07/25/2019 CLINICAL DATA:  Left-sided chest pain and cough. Negative coronavirus test 5 days ago EXAM: CT ANGIOGRAPHY CHEST WITH CONTRAST TECHNIQUE: Multidetector CT imaging of the chest was performed using the standard protocol during bolus administration of intravenous contrast. Multiplanar CT image reconstructions and MIPs were obtained to evaluate the vascular anatomy. CONTRAST:  173mL OMNIPAQUE IOHEXOL 350 MG/ML SOLN COMPARISON:  Chest radiography same day FINDINGS: Cardiovascular: Pulmonary arterial opacification is excellent. There are no pulmonary emboli. Heart size is normal. Minimal aortic atherosclerosis. No visible coronary artery calcification. Mediastinum/Nodes: No mediastinal or hilar mass or lymphadenopathy. Lungs/Pleura: Bilateral lower lobe pulmonary infiltrates consistent with infectious pneumonia. This could be viral or bacterial, but is in fact worrisome for a viral pattern. No dense consolidation, collapse or effusion. Upper Abdomen: Negative Musculoskeletal: Normal Review of the MIP images confirms the above findings. IMPRESSION: No pulmonary emboli. Bilateral patchy pulmonary infiltrates consistent with  bronchopneumonia. The findings could be due to bacterial or viral disease, but are in fact worrisome in particular for viral pneumonia. Electronically Signed   By: Nelson Chimes M.D.   On: 07/25/2019 11:41   Dg Chest Port 1 View  Result Date: 07/25/2019 CLINICAL DATA:  LEFT-sided chest pain with inspiration since last week. EXAM: PORTABLE CHEST 1 VIEW COMPARISON:  None. FINDINGS: Heart size and mediastinal contours are within normal limits. Lungs are clear. No pleural effusion or pneumothorax seen. Osseous structures about the chest are unremarkable. IMPRESSION: No active disease. No evidence of pneumonia or pulmonary edema. Electronically Signed   By: Franki Cabot M.D.   On: 07/25/2019 10:59    Procedures Procedures (including critical care time)  Medications Ordered in ED Medications - No data to display   Initial Impression / Assessment and Plan /  ED Course  I have reviewed the triage vital signs and the nursing notes.  Pertinent labs & imaging results that were available during my care of the patient were reviewed by me and considered in my medical decision making (see chart for details).        MDM  Screen complete  Catalina LungerHarvey Wilson Friedlander was evaluated in Emergency Department on 07/25/2019 for the symptoms described in the history of present illness. He was evaluated in the context of the global COVID-19 pandemic, which necessitated consideration that the patient might be at risk for infection with the SARS-CoV-2 virus that causes COVID-19. Institutional protocols and algorithms that pertain to the evaluation of patients at risk for COVID-19 are in a state of rapid change based on information released by regulatory bodies including the CDC and federal and state organizations. These policies and algorithms were followed during the patient's care in the ED.   Patient is presenting for evaluation of reported cough and pleuritic chest discomfort.  Patient's work-up suggest resolving viral  pneumonia.  Patient with a negative COVID test today and a negative cover test performed last week.  Patient's other labs are without significant abnormality.  Patient appears to be comfortable.  He is not hypoxic.  He is desiring discharge.  Patient does understand the need for close follow-up.  Strict return precautions given and understood.  Out of abundance of caution, I will prescribe a short course of Augmentin to cover the possibility of a bacterial pneumonia - however, given patient's presentation this seems unlikely.  Final Clinical Impressions(s) / ED Diagnoses   Final diagnoses:  Community acquired pneumonia, unspecified laterality    ED Discharge Orders         Ordered    amoxicillin-clavulanate (AUGMENTIN) 875-125 MG tablet  2 times daily     07/25/19 1222           Wynetta FinesMessick, Ineze Serrao C, MD 07/25/19 1230

## 2019-07-31 ENCOUNTER — Emergency Department (HOSPITAL_COMMUNITY)
Admission: EM | Admit: 2019-07-31 | Discharge: 2019-07-31 | Disposition: A | Discharge status: Home / Self Care | Payer: 59 | Attending: Emergency Medicine | Admitting: Emergency Medicine

## 2019-07-31 ENCOUNTER — Encounter (HOSPITAL_COMMUNITY): Payer: Self-pay

## 2019-07-31 ENCOUNTER — Emergency Department (HOSPITAL_COMMUNITY): Payer: 59

## 2019-07-31 ENCOUNTER — Other Ambulatory Visit: Payer: Self-pay

## 2019-07-31 DIAGNOSIS — F17210 Nicotine dependence, cigarettes, uncomplicated: Secondary | ICD-10-CM | POA: Diagnosis not present

## 2019-07-31 DIAGNOSIS — S20211A Contusion of right front wall of thorax, initial encounter: Secondary | ICD-10-CM

## 2019-07-31 DIAGNOSIS — Y929 Unspecified place or not applicable: Secondary | ICD-10-CM | POA: Diagnosis not present

## 2019-07-31 DIAGNOSIS — W01198A Fall on same level from slipping, tripping and stumbling with subsequent striking against other object, initial encounter: Secondary | ICD-10-CM | POA: Diagnosis not present

## 2019-07-31 DIAGNOSIS — Y939 Activity, unspecified: Secondary | ICD-10-CM | POA: Diagnosis not present

## 2019-07-31 DIAGNOSIS — Y999 Unspecified external cause status: Secondary | ICD-10-CM | POA: Diagnosis not present

## 2019-07-31 DIAGNOSIS — S299XXA Unspecified injury of thorax, initial encounter: Secondary | ICD-10-CM | POA: Diagnosis present

## 2019-07-31 DIAGNOSIS — S2231XA Fracture of one rib, right side, initial encounter for closed fracture: Secondary | ICD-10-CM | POA: Diagnosis not present

## 2019-07-31 DIAGNOSIS — W010XXA Fall on same level from slipping, tripping and stumbling without subsequent striking against object, initial encounter: Secondary | ICD-10-CM

## 2019-07-31 MED ORDER — ACETAMINOPHEN 500 MG PO TABS
1000.0000 mg | ORAL_TABLET | Freq: Once | ORAL | Status: AC
  Administered 2019-07-31: 1000 mg via ORAL
  Filled 2019-07-31: qty 2

## 2019-07-31 NOTE — Discharge Instructions (Addendum)
It was our pleasure to provide your ER care today - we hope that you feel better.  Take acetaminophen or ibuprofen as need.  Take full, complete deep breaths. Use incentive spirometer, 10 full breaths in every hour while awake.   Follow up with primary care doctor in 1 week if symptoms fail to improve/resolve.  Return to ER if worse, new symptoms, fevers, increased trouble breathing, or other concern.

## 2019-07-31 NOTE — ED Triage Notes (Signed)
Pt states he tripped and fell on Thursday. Pt states he is having pain in his right rib area. Pt states he needs a work note for light duty.

## 2019-07-31 NOTE — ED Provider Notes (Addendum)
[facility]-EMERGENCY DEPT Provider Note   CSN: [id number] Arrival date & time: 07/31/19  1504     History   Chief Complaint Chief Complaint  Patient presents with  . Rib Pain    HPI Troy Pacheco is a 58 y.o. male.     Patient c/o trip and fall 4 days ago, fall against right lower ribs. Pain acute onset then - pain dull, constant, moderate, worse w movement and palpation area. States his earlier cough and fever have resolved - feels better from that standpoint. Patient denies other pain or injury. No headache or head injury. No loc. No neck or back pain. No radicular pain. No sob. No abd pain or nv. No extremity pain or injury. Ambulatory since.   The history is provided by the patient.    Past Medical History:  Diagnosis Date  . Depression   . Post traumatic stress disorder (PTSD)     Patient Active Problem List   Diagnosis Date Noted  . Need for prophylactic vaccination and inoculation against influenza 10/01/2017  . Penile discharge 10/01/2017  . Encounter for general adult medical examination with abnormal findings 07/16/2017  . Urethritis 07/16/2017    History reviewed. No pertinent surgical history.      Home Medications    Prior to Admission medications   Medication Sig Start Date End Date Taking? Authorizing Provider  amoxicillin-clavulanate (AUGMENTIN) 875-125 MG tablet Take 1 tablet by mouth 2 (two) times daily. 07/25/19   Wynetta FinesMessick, Peter C, MD  ibuprofen (ADVIL) 200 MG tablet Take 400 mg by mouth every 6 (six) hours as needed for fever, headache or moderate pain.    [provider]  polyvinyl alcohol (LIQUIFILM TEARS) 1.4 % ophthalmic solution Place 1 drop into both eyes as needed for dry eyes.    [provider]    Family History Family History  Problem Relation Age of Onset  . Aneurysm Brother        brain  . Diabetes Maternal Grandmother   . Heart disease Maternal Grandmother        heart attack     Social History Social History   Tobacco Use  . Smoking status: Current Every Day Smoker    Packs/day: 0.50    Years: 20.00    Pack years: 10.00    Types: Cigarettes  . Smokeless tobacco: Never Used  Substance Use Topics  . Alcohol use: Not on file  . Drug use: Yes    Types: Marijuana     Allergies   Patient has no known allergies.   Review of Systems Review of Systems  Constitutional: Negative for fever.  HENT: Negative for sore throat.   Eyes: Negative for pain.  Respiratory: Negative for cough and shortness of breath.   Cardiovascular: Negative for leg swelling.  Gastrointestinal: Negative for abdominal pain, nausea and vomiting.  Genitourinary: Negative for flank pain.  Musculoskeletal: Negative for back pain and neck pain.  Skin: Negative for wound.  Neurological: Negative for weakness, numbness and headaches.  Hematological: Does not bruise/bleed easily.  Psychiatric/Behavioral: Negative for confusion.     Physical Exam Updated Vital Signs BP 135/80   Pulse 82   Temp 98.9 F (37.2 C) (Oral)   Resp 16   Wt 71 kg   SpO2 96%   BMI 20.65 kg/m   Physical Exam Vitals signs and nursing note reviewed.  Constitutional:      Appearance: Normal appearance. He is well-developed.  HENT:  Head: Atraumatic.     Nose: Nose normal.     Mouth/Throat:     Mouth: Mucous membranes are moist.     Pharynx: Oropharynx is clear.  Eyes:     General: No scleral icterus.    Conjunctiva/sclera: Conjunctivae normal.  Neck:     Musculoskeletal: Normal range of motion and neck supple. No neck rigidity.     Trachea: No tracheal deviation.  Cardiovascular:     Rate and Rhythm: Normal rate and regular rhythm.     Pulses: Normal pulses.     Heart sounds: Normal heart sounds. No murmur. No friction rub. No gallop.   Pulmonary:     Effort: Pulmonary effort is normal. No accessory muscle usage or respiratory distress.     Breath sounds: Normal breath sounds.     Comments:  Right lower chest wall tenderness reproducing symptoms. Normal chest wall movement. No crepitus.  Chest:     Chest wall: Tenderness present.  Abdominal:     General: Bowel sounds are normal. There is no distension.     Palpations: Abdomen is soft.     Tenderness: There is no abdominal tenderness. There is no guarding.     Comments: No abd bruising, contusion or tenderness.   Genitourinary:    Comments: No cva tenderness. Musculoskeletal:        General: No swelling or tenderness.     Comments: CTLS spine, non tender, aligned, no step off. Good rom bil ext without pain or focal bony tenderness.   Skin:    General: Skin is warm and dry.     Findings: No rash.  Neurological:     Mental Status: He is alert.     Comments: Alert, speech clear. GCS 15. Motor intact bil,stre 5/5. sens grossly intact bil. Steady gait.   Psychiatric:        Mood and Affect: Mood normal.      ED Treatments / Results  Labs (all labs ordered are listed, but only abnormal results are displayed) Labs Reviewed - No data to display  EKG None  Radiology Dg Ribs Unilateral W/chest Right  Result Date: 07/31/2019 CLINICAL DATA:  Right lower rib pain laterally and anteriorly following a fall 4 days ago. Smoker. EXAM: RIGHT RIBS AND CHEST - 3+ VIEW COMPARISON:  Chest dated 07/25/2019 FINDINGS: Normal sized heart. Minimal patchy and linear density at the left lung base without significant change. Mildly progressive mild prominence of the interstitial markings in the lower lung zones. Mild peribronchial thickening with mild progression. The lungs remain hyperexpanded. Nondisplaced fracture in the distal aspect of the right 11th rib. This corresponds to the location of pain, marked with a metallic BB. No pneumothorax. IMPRESSION: 1. Nondisplaced right 11th rib fracture. 2. Stable minimal left basilar atelectasis, scarring or focal pneumonia. 3. Mildly progressive bronchitic changes. 4. Stable changes of COPD.  Electronically Signed   By: Beckie Salts M.D.   On: 07/31/2019 16:24    Procedures Procedures (including critical care time)  Medications Ordered in ED Medications - No data to display   Initial Impression / Assessment and Plan / ED Course  I have reviewed the triage vital signs and the nursing notes.  Pertinent labs & imaging results that were available during my care of the patient were reviewed by me and considered in my medical decision making (see chart for details).  Xrays ordered.   Reviewed nursing notes and prior charts for additional history.   Acetaminophen po.  Xrays reviewed by  me - right 11th rib fx.  Given injury was 4 days ago and no narcotic pain meds at home, and comfortable in ED with otc pain med - do not feel opiate rx is needed for home.   Discussed xrays w pt.   Patient appears stable for d/c.  Return precautions provided.   Pt requests note to go back to work tomorrow, light duty - provided.   Final Clinical Impressions(s) / ED Diagnoses   Final diagnoses:  None    ED Discharge Orders    None           Lajean Saver, MD 07/31/19 1645

## 2021-01-09 IMAGING — CT CT ANGIO CHEST
3 of 7 series · 19 of 36 positions shown · IV contrast (omnipaque)
Comparison: Chest radiography same day

CLINICAL DATA: Left-sided chest pain and cough. Negative
coronavirus test 5 days ago

EXAM:
CT ANGIOGRAPHY CHEST WITH CONTRAST
TECHNIQUE: Multidetector CT imaging of the chest was performed using the
standard protocol during bolus administration of intravenous
contrast. Multiplanar CT image reconstructions and MIPs were
obtained to evaluate the vascular anatomy.
CONTRAST:  100mL OMNIPAQUE IOHEXOL 350 MG/ML SOLN

[Series 5: thins · axial · 0.71mm/px · z∈[-351,-38]mm · 14 of 363 slices shown]
[im 25/363  lung]
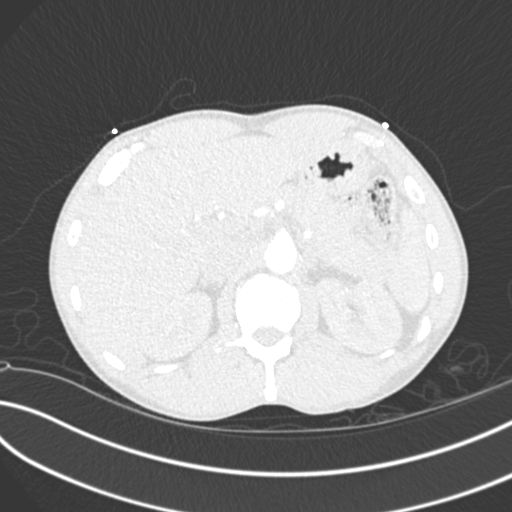
[im 49/363  mediastinal]
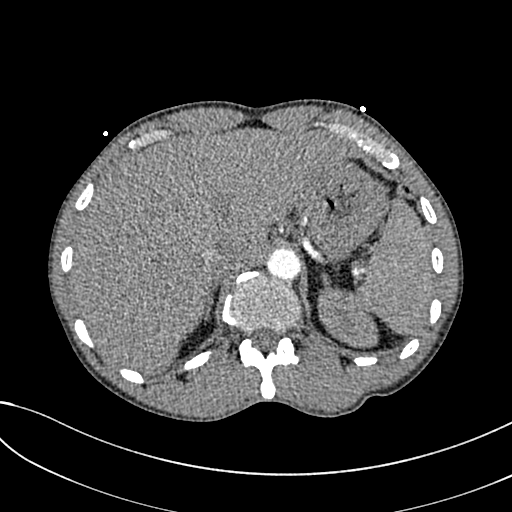
[im 73/363  lung]
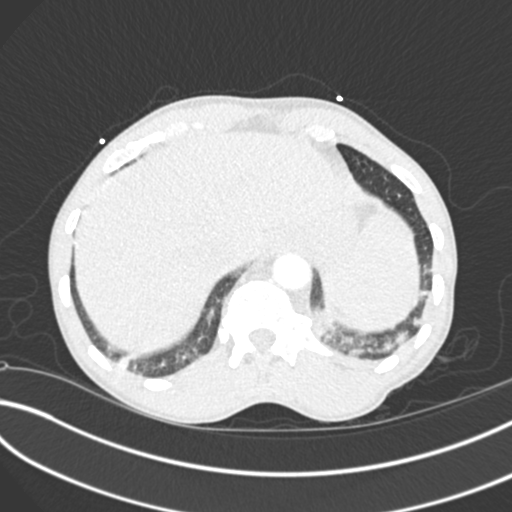
[im 97/363  mediastinal]
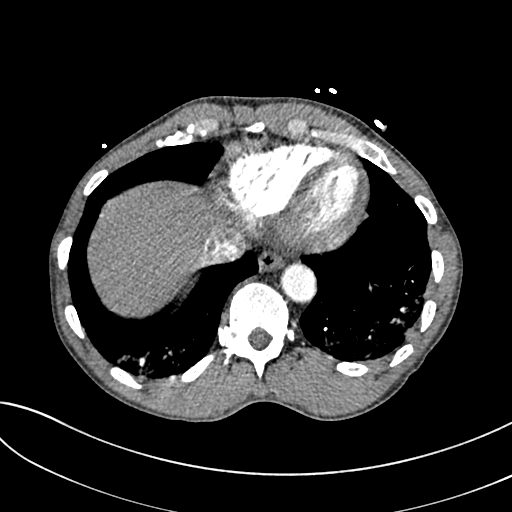
[im 121/363  lung]
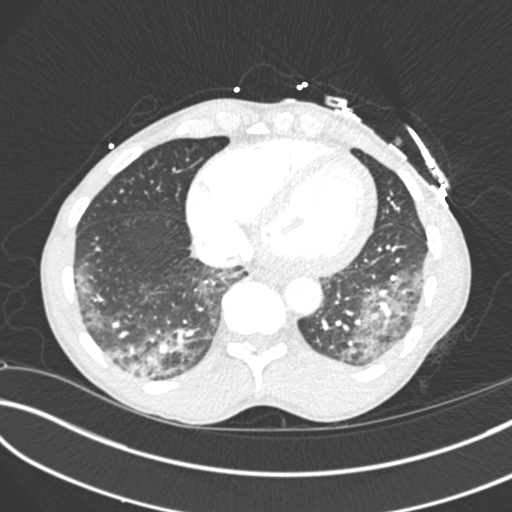
[im 145/363  mediastinal]
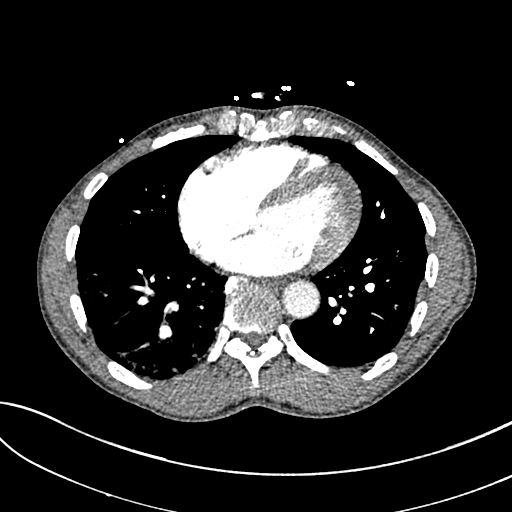
[im 169/363  lung]
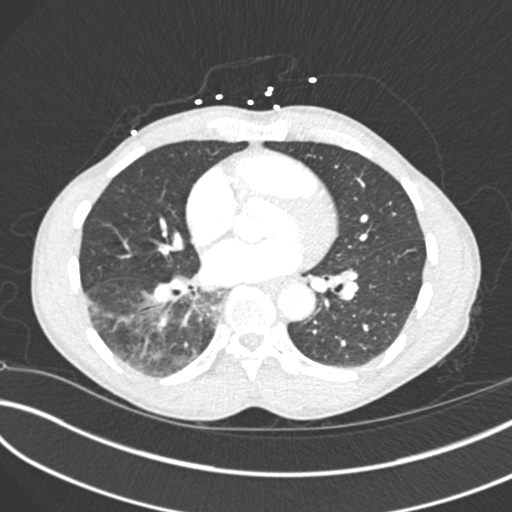
[im 194/363  mediastinal]
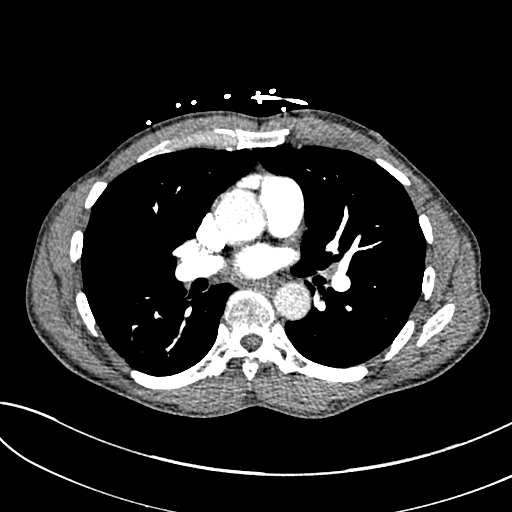
[im 218/363  lung]
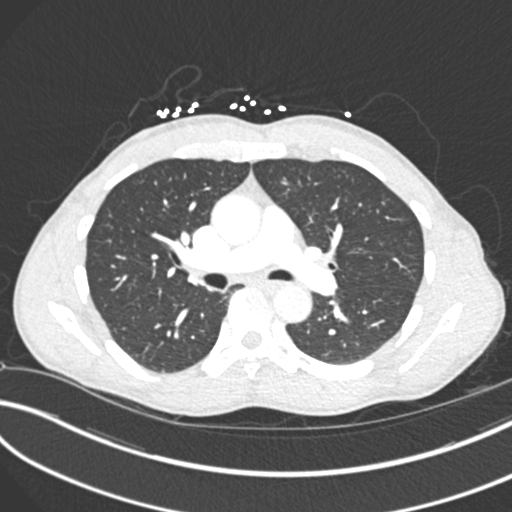
[im 242/363  mediastinal]
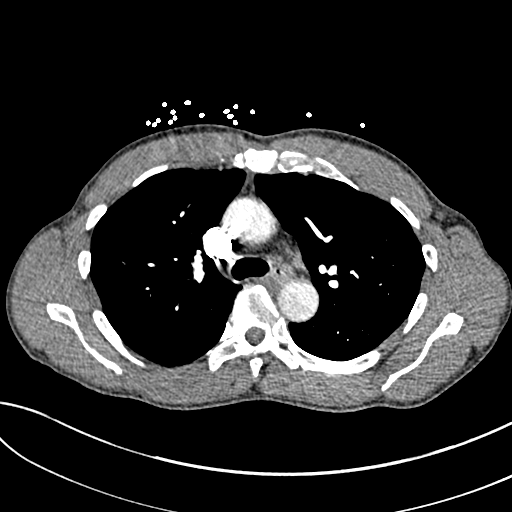
[im 266/363  lung]
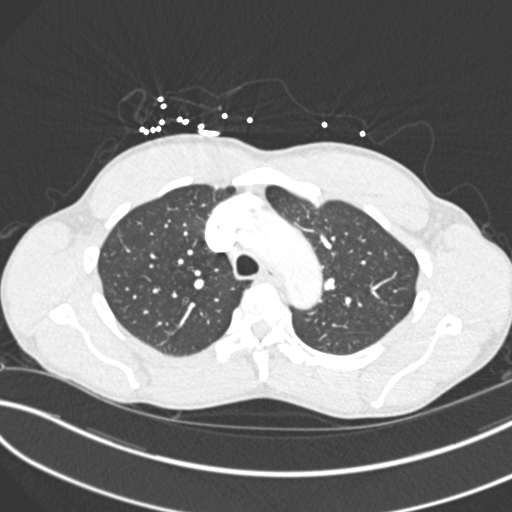
[im 290/363  mediastinal]
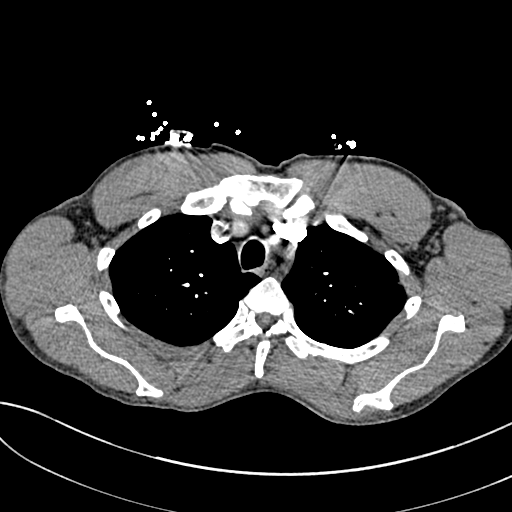
[im 314/363  lung]
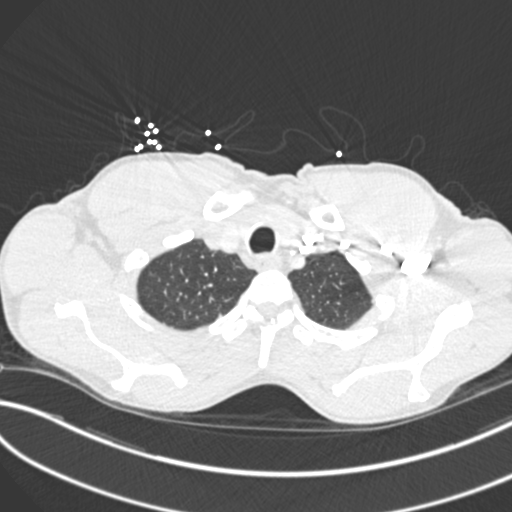
[im 338/363  mediastinal]
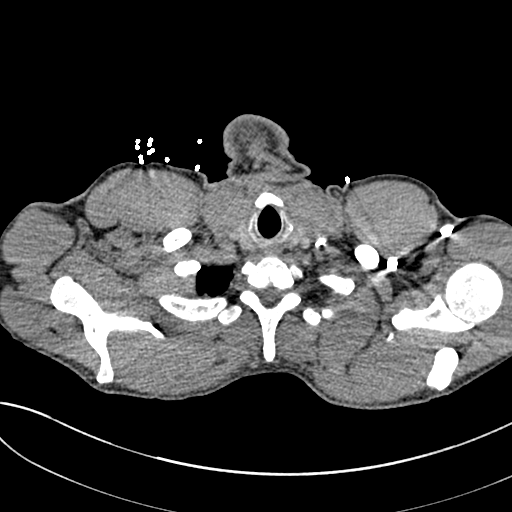

[Series 6: coronal mpr · coronal · 0.68mm/px · 1 of 114 slices shown]
[im 57/114  mediastinal]
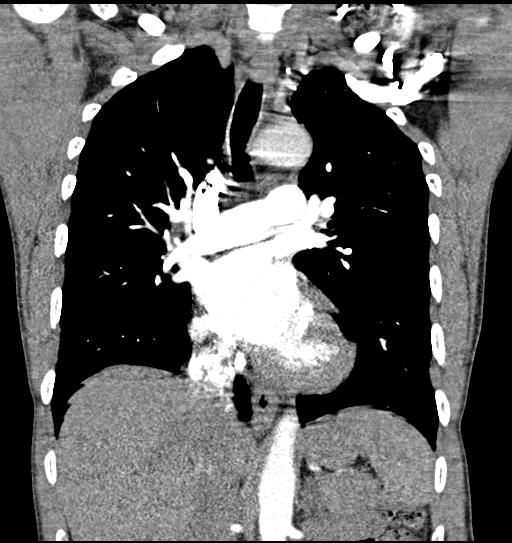

[Series 10: lung · axial · 0.71mm/px · z∈[-289,-123]mm · 4 of 166 slices shown]
[im 28/166  mediastinal]
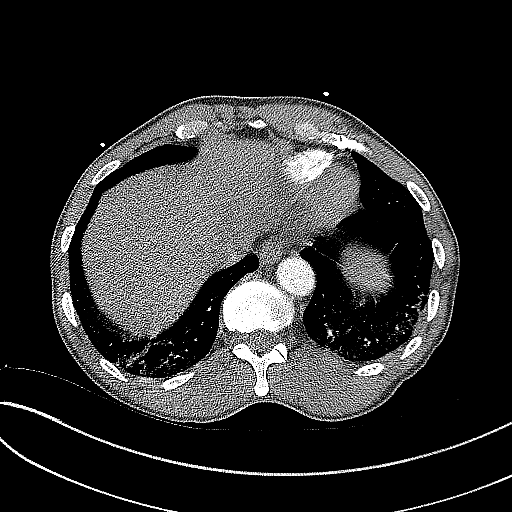
[im 56/166  mediastinal]
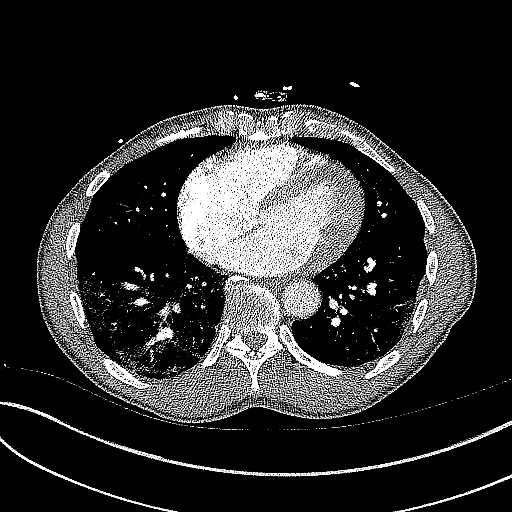
[im 83/166  mediastinal]
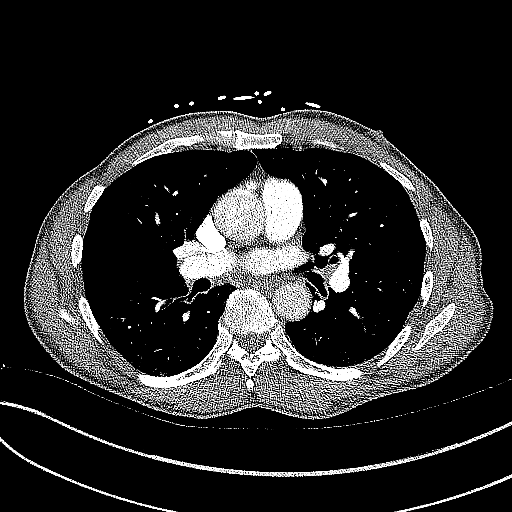
[im 111/166  mediastinal]
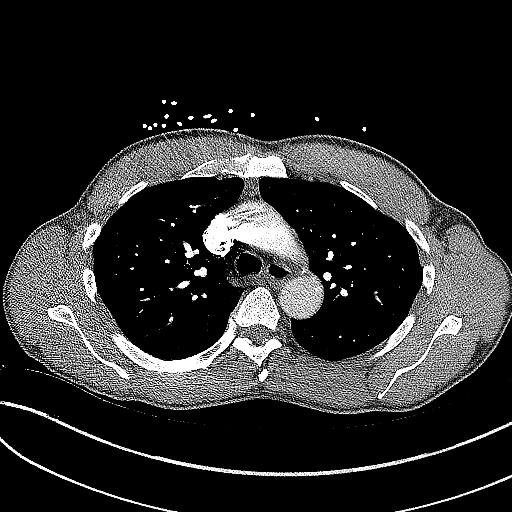

[19 of 36 positions shown; findings below may reference images not displayed]

FINDINGS: Cardiovascular: Pulmonary arterial opacification is excellent. There
are no pulmonary emboli. Heart size is normal. Minimal aortic
atherosclerosis. No visible coronary artery calcification.

Mediastinum/Nodes: No mediastinal or hilar mass or lymphadenopathy.

Lungs/Pleura: Bilateral lower lobe pulmonary infiltrates consistent
with infectious pneumonia. This could be viral or bacterial, but is
in fact worrisome for a viral pattern. No dense consolidation,
collapse or effusion.

Upper Abdomen: Negative

Musculoskeletal: Normal

Review of the MIP images confirms the above findings.
IMPRESSION: No pulmonary emboli.

Bilateral patchy pulmonary infiltrates consistent with
bronchopneumonia. The findings could be due to bacterial or viral
disease, but are in fact worrisome in particular for viral
pneumonia.

## 2021-01-15 IMAGING — CR DG RIBS W/ CHEST 3+V*R*
3 series · 3 of 3 positions shown · non-contrast
Comparison: Chest dated 07/25/2019

CLINICAL DATA: Right lower rib pain laterally and anteriorly
following a fall 4 days ago. Smoker.

EXAM:
RIGHT RIBS AND CHEST - 3+ VIEW

[w chest pa]
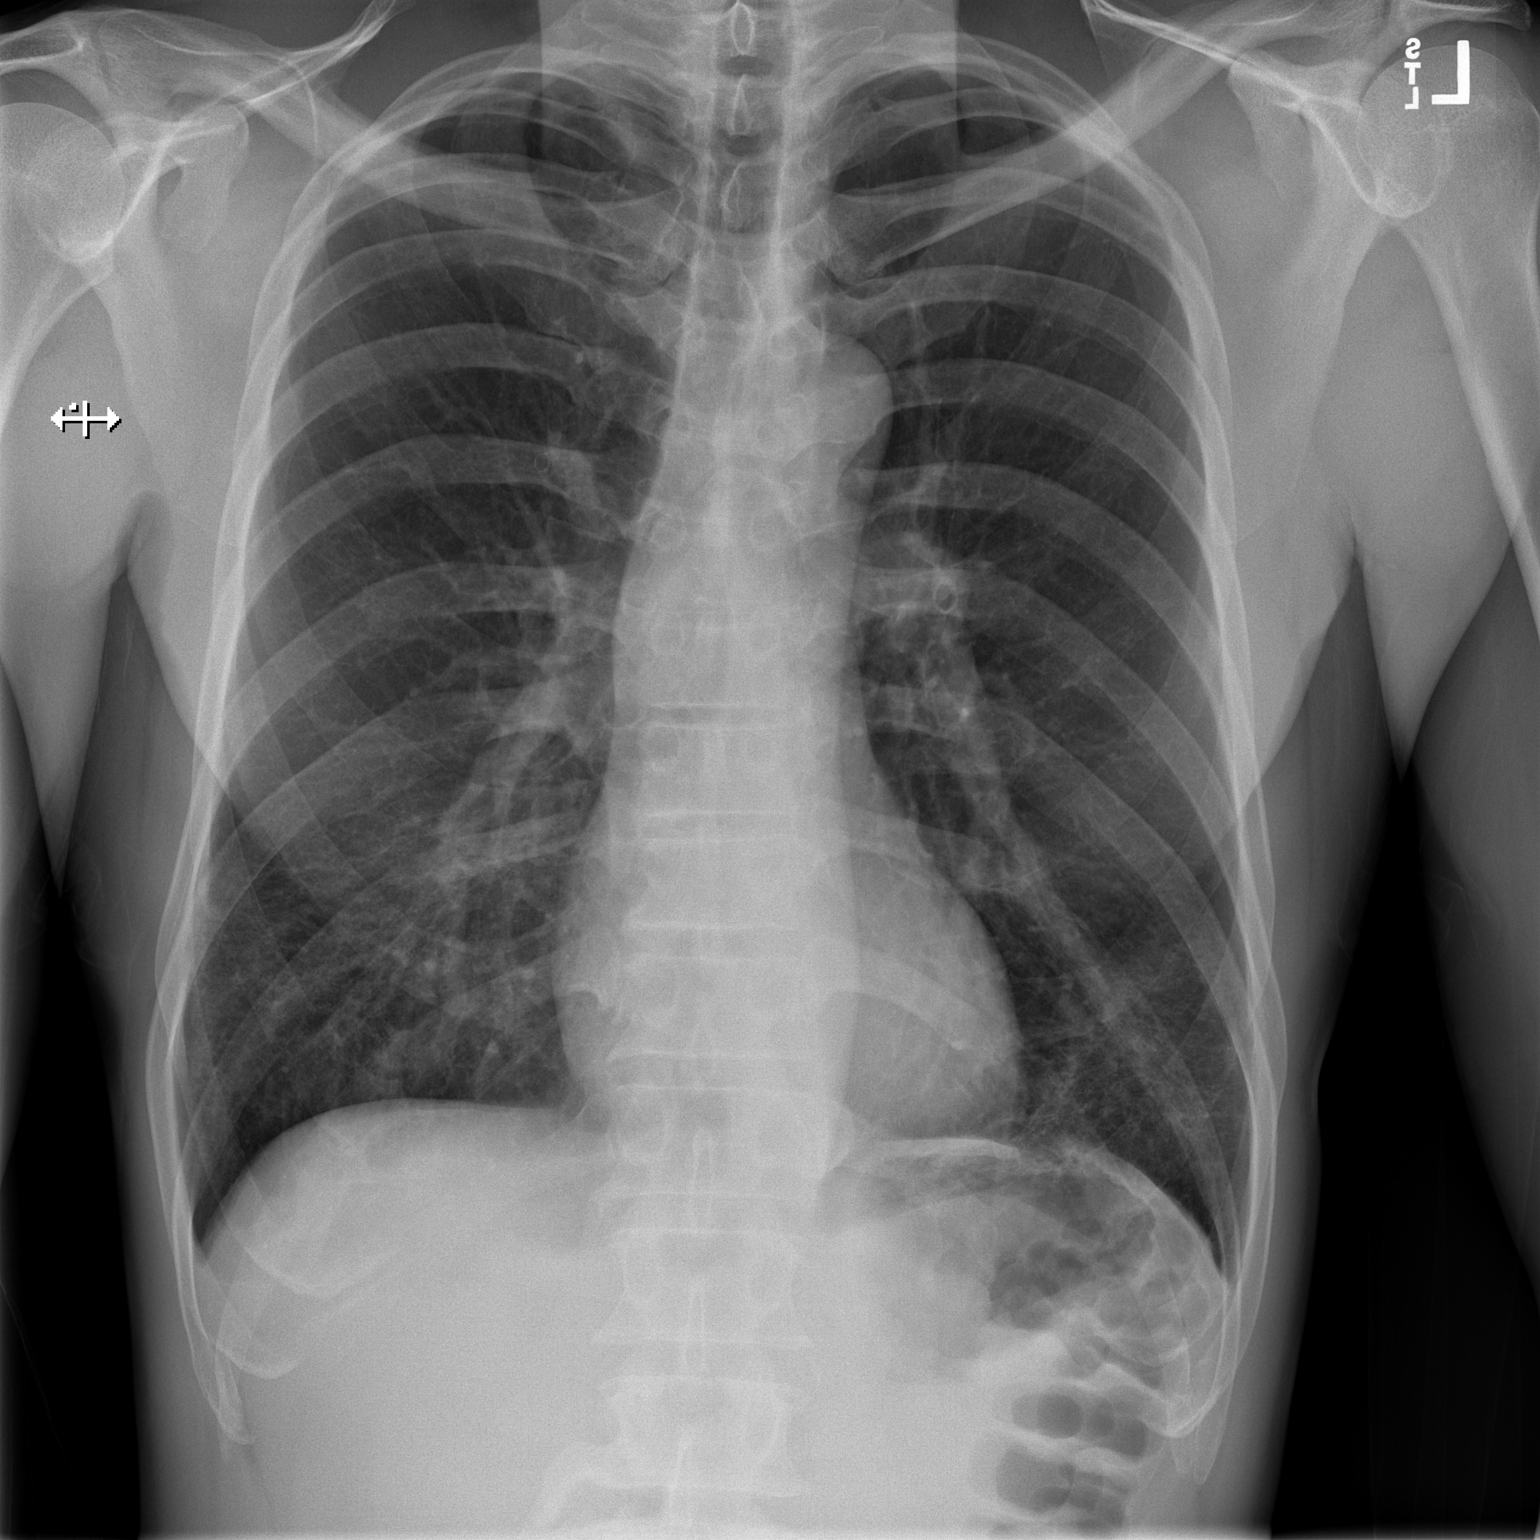

[w ribs ap upper right]
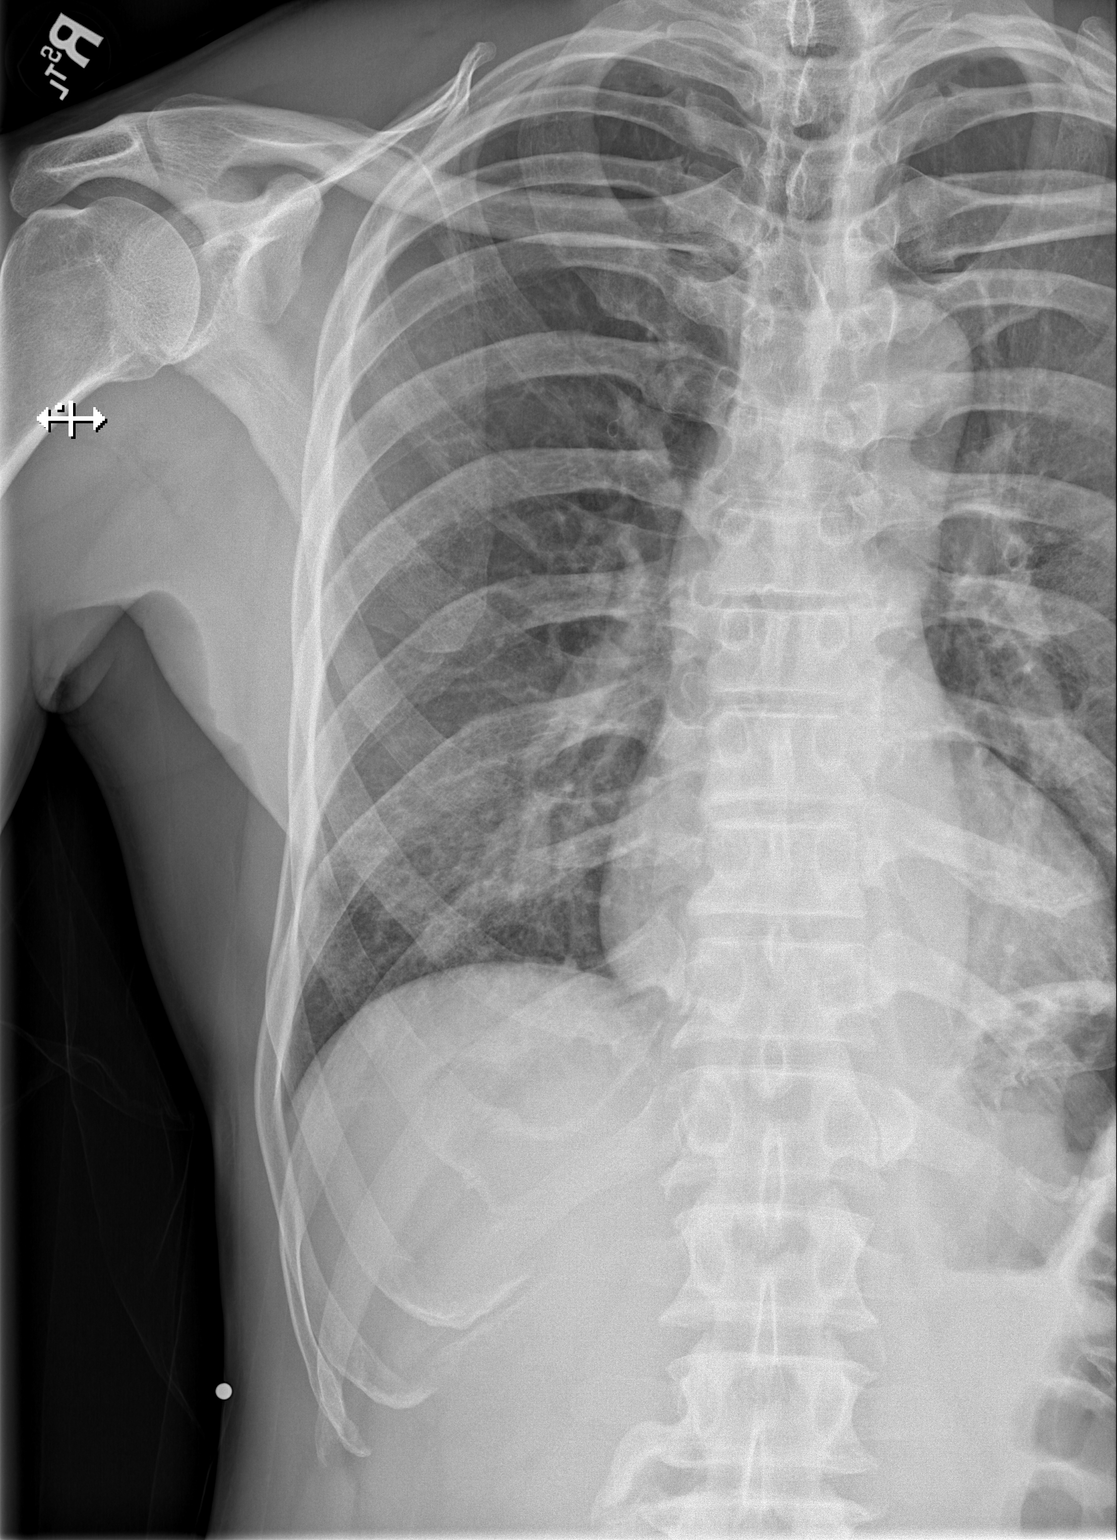

[w ribs obl right]
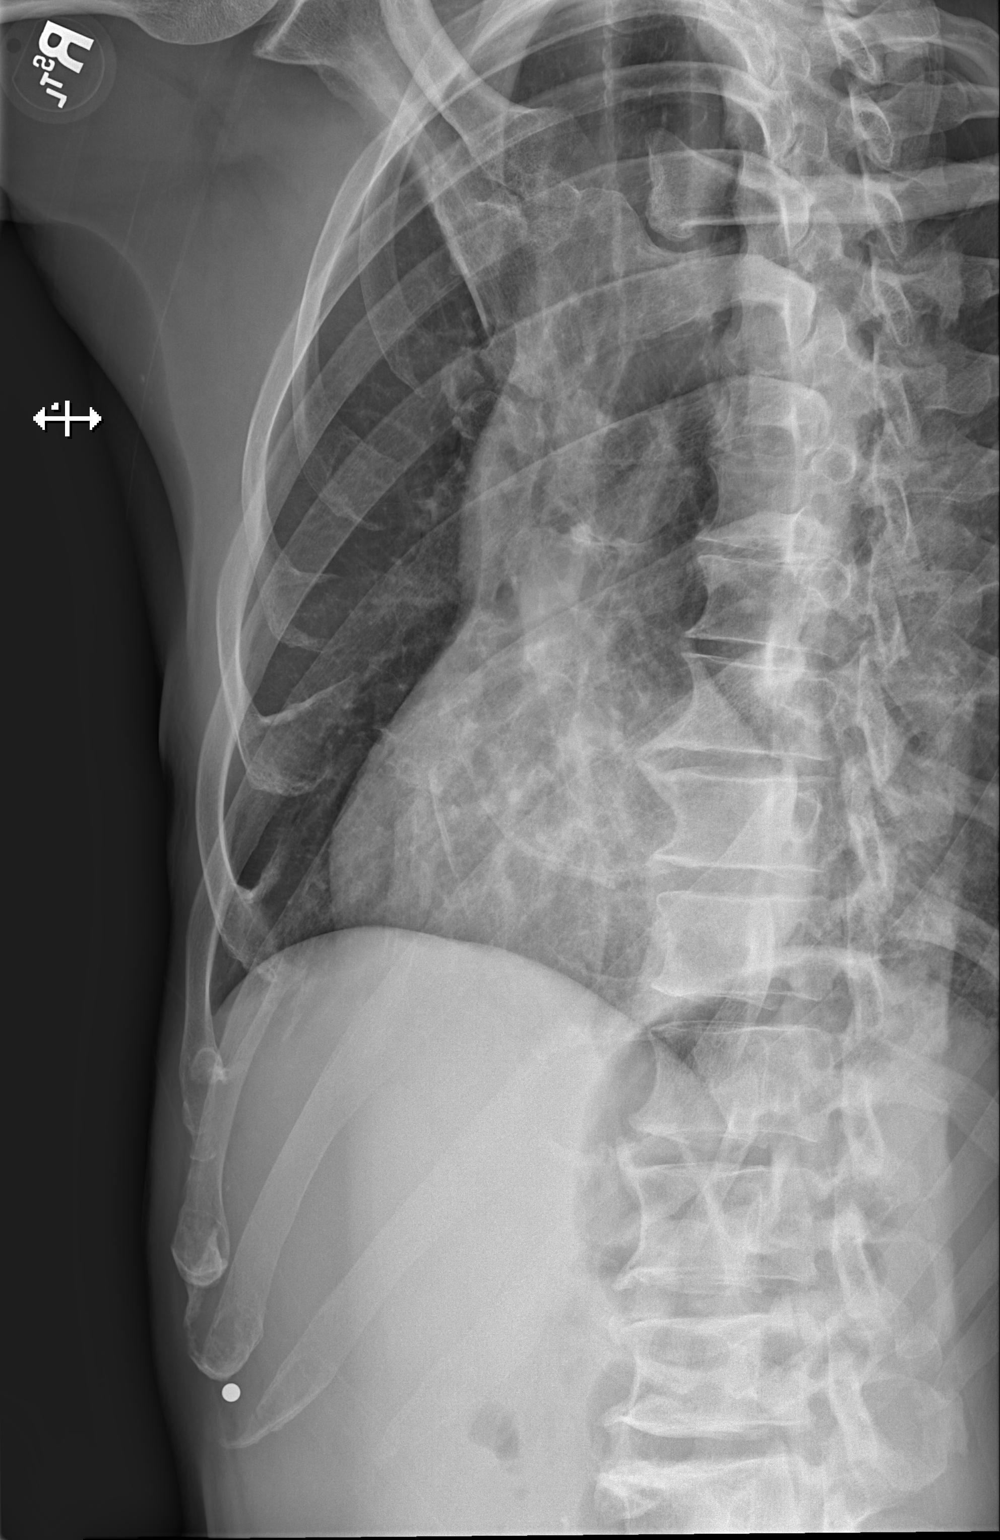

[3 of 3 positions shown; findings below may reference images not displayed]

FINDINGS: Normal sized heart. Minimal patchy and linear density at the left
lung base without significant change. Mildly progressive mild
prominence of the interstitial markings in the lower lung zones.
Mild peribronchial thickening with mild progression. The lungs
remain hyperexpanded. Nondisplaced fracture in the distal aspect of
the right 11th rib. This corresponds to the location of pain, marked
with a metallic BB. No pneumothorax.
IMPRESSION: 1. Nondisplaced right 11th rib fracture.
2. Stable minimal left basilar atelectasis, scarring or focal
pneumonia.
3. Mildly progressive bronchitic changes.
4. Stable changes of COPD.

## 2022-07-29 ENCOUNTER — Encounter: Payer: Self-pay | Admitting: *Deleted

## 2022-07-29 ENCOUNTER — Encounter: Payer: Self-pay | Admitting: Physician Assistant

## 2022-07-29 ENCOUNTER — Other Ambulatory Visit: Payer: Self-pay | Admitting: Critical Care Medicine
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
## 2022-09-02 ENCOUNTER — Encounter: Payer: Self-pay | Admitting: Physician Assistant

## 2022-09-02 ENCOUNTER — Encounter: Payer: Self-pay | Admitting: Critical Care Medicine

## 2022-09-03 NOTE — Progress Notes (Signed)
Has severe OA of both knees,  Needs new knee braces.  These were provided. [name]
